# Patient Record
Sex: Male | Born: 1960 | Race: Black or African American | Hispanic: No | Marital: Single | Smoking: Never smoker
Health system: Southern US, Community
[De-identification: ages and names within clinical notes are randomized; demographics above are authoritative.]

## PROBLEM LIST (undated history)

## (undated) DIAGNOSIS — E119 Type 2 diabetes mellitus without complications: Secondary | ICD-10-CM

## (undated) DIAGNOSIS — I10 Essential (primary) hypertension: Secondary | ICD-10-CM

---

## 2018-06-15 ENCOUNTER — Other Ambulatory Visit: Payer: Self-pay

## 2018-06-15 ENCOUNTER — Encounter: Payer: Self-pay | Admitting: Emergency Medicine

## 2018-06-15 ENCOUNTER — Emergency Department: Payer: BLUE CROSS/BLUE SHIELD

## 2018-06-15 ENCOUNTER — Inpatient Hospital Stay
Admission: EM | Admit: 2018-06-15 | Discharge: 2018-06-16 | DRG: 292 | Disposition: A | Discharge status: Home / Self Care | Payer: BLUE CROSS/BLUE SHIELD | Attending: Internal Medicine | Admitting: Internal Medicine

## 2018-06-15 DIAGNOSIS — E876 Hypokalemia: Secondary | ICD-10-CM | POA: Diagnosis present

## 2018-06-15 DIAGNOSIS — F419 Anxiety disorder, unspecified: Secondary | ICD-10-CM | POA: Diagnosis present

## 2018-06-15 DIAGNOSIS — Z79899 Other long term (current) drug therapy: Secondary | ICD-10-CM | POA: Diagnosis not present

## 2018-06-15 DIAGNOSIS — I110 Hypertensive heart disease with heart failure: Principal | ICD-10-CM | POA: Diagnosis present

## 2018-06-15 DIAGNOSIS — Z7984 Long term (current) use of oral hypoglycemic drugs: Secondary | ICD-10-CM

## 2018-06-15 DIAGNOSIS — R0602 Shortness of breath: Secondary | ICD-10-CM | POA: Diagnosis not present

## 2018-06-15 DIAGNOSIS — I509 Heart failure, unspecified: Secondary | ICD-10-CM | POA: Diagnosis present

## 2018-06-15 DIAGNOSIS — I248 Other forms of acute ischemic heart disease: Secondary | ICD-10-CM | POA: Diagnosis present

## 2018-06-15 DIAGNOSIS — E119 Type 2 diabetes mellitus without complications: Secondary | ICD-10-CM | POA: Diagnosis present

## 2018-06-15 HISTORY — DX: Type 2 diabetes mellitus without complications: E11.9

## 2018-06-15 HISTORY — DX: Essential (primary) hypertension: I10

## 2018-06-15 LAB — COMPREHENSIVE METABOLIC PANEL
ALT: 41 U/L (ref 0–44)
AST: 24 U/L (ref 15–41)
Albumin: 4.1 g/dL (ref 3.5–5.0)
Alkaline Phosphatase: 37 U/L — ABNORMAL LOW (ref 38–126)
Anion gap: 9 (ref 5–15)
BUN: 15 mg/dL (ref 6–20)
CHLORIDE: 108 mmol/L (ref 98–111)
CO2: 23 mmol/L (ref 22–32)
Calcium: 8.9 mg/dL (ref 8.9–10.3)
Creatinine, Ser: 1.2 mg/dL (ref 0.61–1.24)
GFR calc Af Amer: >60 mL/min (ref >60)
Glucose, Bld: 116 mg/dL — ABNORMAL HIGH (ref 70–99)
POTASSIUM: 3.7 mmol/L (ref 3.5–5.1)
Sodium: 140 mmol/L (ref 135–145)
Total Bilirubin: 1.4 mg/dL — ABNORMAL HIGH (ref 0.3–1.2)
Total Protein: 7.6 g/dL (ref 6.5–8.1)

## 2018-06-15 LAB — URINALYSIS, COMPLETE (UACMP) WITH MICROSCOPIC
Bacteria, UA: NONE SEEN
Bilirubin Urine: NEGATIVE
GLUCOSE, UA: NEGATIVE mg/dL
Ketones, ur: NEGATIVE mg/dL
Leukocytes,Ua: NEGATIVE
Nitrite: NEGATIVE
PH: 5 (ref 5.0–8.0)
PROTEIN: NEGATIVE mg/dL
Specific Gravity, Urine: 1.016 (ref 1.005–1.030)
Squamous Epithelial / HPF: NONE SEEN (ref 0–5)

## 2018-06-15 LAB — CBC WITH DIFFERENTIAL/PLATELET
Abs Immature Granulocytes: 0.02 10*3/uL (ref 0.00–0.07)
Basophils Absolute: 0.1 10*3/uL (ref 0.0–0.1)
Basophils Relative: 1 %
Eosinophils Absolute: 0.1 10*3/uL (ref 0.0–0.5)
Eosinophils Relative: 1 %
HCT: 50.8 % (ref 39.0–52.0)
Hemoglobin: 16.6 g/dL (ref 13.0–17.0)
Immature Granulocytes: 0 %
Lymphocytes Relative: 21 %
Lymphs Abs: 1.7 10*3/uL (ref 0.7–4.0)
MCH: 27.2 pg (ref 26.0–34.0)
MCHC: 32.7 g/dL (ref 30.0–36.0)
MCV: 83.1 fL (ref 80.0–100.0)
Monocytes Absolute: 0.8 10*3/uL (ref 0.1–1.0)
Monocytes Relative: 10 %
Neutro Abs: 5.5 10*3/uL (ref 1.7–7.7)
Neutrophils Relative %: 67 %
PLATELETS: 192 10*3/uL (ref 150–400)
RBC: 6.11 MIL/uL — AB (ref 4.22–5.81)
RDW: 15.9 % — ABNORMAL HIGH (ref 11.5–15.5)
WBC: 8.2 10*3/uL (ref 4.0–10.5)
nRBC: 0 % (ref 0.0–0.2)

## 2018-06-15 LAB — TROPONIN I
TROPONIN I: 0.05 ng/mL — AB (ref <0.03)
Troponin I: 0.07 ng/mL (ref <0.03)
Troponin I: 0.07 ng/mL (ref <0.03)

## 2018-06-15 LAB — GLUCOSE, CAPILLARY
Glucose-Capillary: 202 mg/dL — ABNORMAL HIGH (ref 70–99)
Glucose-Capillary: 112 mg/dL — ABNORMAL HIGH (ref 70–99)

## 2018-06-15 MED ORDER — ASPIRIN EC 81 MG PO TBEC
81.0000 mg | DELAYED_RELEASE_TABLET | Freq: Every day | ORAL | Status: DC
  Administered 2018-06-15 – 2018-06-16 (×2): 81 mg via ORAL
  Filled 2018-06-15: qty 1

## 2018-06-15 MED ORDER — SODIUM CHLORIDE 0.9 % IV SOLN: 250.0000 mL | INTRAVENOUS | Status: DC | PRN

## 2018-06-15 MED ORDER — INSULIN ASPART 100 UNIT/ML ~~LOC~~ SOLN
0.0000 [IU] | Freq: Three times a day (TID) | SUBCUTANEOUS | Status: DC
  Administered 2018-06-15: 5 [IU] via SUBCUTANEOUS
  Administered 2018-06-16: 3 [IU] via SUBCUTANEOUS
  Filled 2018-06-15 (×2): qty 1

## 2018-06-15 MED ORDER — FUROSEMIDE 10 MG/ML IJ SOLN
40.0000 mg | Freq: Once | INTRAMUSCULAR | Status: AC
  Administered 2018-06-15: 40 mg via INTRAVENOUS
  Filled 2018-06-15: qty 4

## 2018-06-15 MED ORDER — ENOXAPARIN SODIUM 40 MG/0.4ML ~~LOC~~ SOLN: 40.0000 mg | SUBCUTANEOUS | Status: DC

## 2018-06-15 MED ORDER — LABETALOL HCL 5 MG/ML IV SOLN
10.0000 mg | Freq: Once | INTRAVENOUS | Status: AC
  Administered 2018-06-15: 10 mg via INTRAVENOUS
  Filled 2018-06-15: qty 4

## 2018-06-15 MED ORDER — SODIUM CHLORIDE 0.9% FLUSH
3.0000 mL | Freq: Two times a day (BID) | INTRAVENOUS | Status: DC
  Administered 2018-06-15 – 2018-06-16 (×2): 3 mL via INTRAVENOUS

## 2018-06-15 MED ORDER — ALPRAZOLAM 0.25 MG PO TABS
0.2500 mg | ORAL_TABLET | Freq: Two times a day (BID) | ORAL | Status: DC | PRN
  Administered 2018-06-15: 0.25 mg via ORAL
  Filled 2018-06-15: qty 1

## 2018-06-15 MED ORDER — METHOCARBAMOL 500 MG PO TABS
500.0000 mg | ORAL_TABLET | Freq: Three times a day (TID) | ORAL | Status: DC | PRN
  Filled 2018-06-15: qty 1

## 2018-06-15 MED ORDER — HYDRALAZINE HCL 20 MG/ML IJ SOLN: 15.0000 mg | INTRAMUSCULAR | Status: DC | PRN

## 2018-06-15 MED ORDER — NITROGLYCERIN 0.4 MG/SPRAY TL SOLN
1.0000 | Status: DC | PRN
  Filled 2018-06-15: qty 4.9

## 2018-06-15 MED ORDER — ACETAMINOPHEN 325 MG PO TABS: 650.0000 mg | ORAL_TABLET | ORAL | Status: DC | PRN

## 2018-06-15 MED ORDER — CARVEDILOL 12.5 MG PO TABS
12.5000 mg | ORAL_TABLET | Freq: Two times a day (BID) | ORAL | Status: DC
  Administered 2018-06-15 – 2018-06-16 (×2): 12.5 mg via ORAL
  Filled 2018-06-15 (×2): qty 1

## 2018-06-15 MED ORDER — ATORVASTATIN CALCIUM 20 MG PO TABS
20.0000 mg | ORAL_TABLET | Freq: Every day | ORAL | Status: DC
  Administered 2018-06-15: 20 mg via ORAL
  Filled 2018-06-15: qty 1

## 2018-06-15 MED ORDER — PNEUMOCOCCAL VAC POLYVALENT 25 MCG/0.5ML IJ INJ
0.5000 mL | INJECTION | INTRAMUSCULAR | Status: DC
  Filled 2018-06-15: qty 0.5

## 2018-06-15 MED ORDER — FUROSEMIDE 10 MG/ML IJ SOLN
40.0000 mg | Freq: Two times a day (BID) | INTRAMUSCULAR | Status: DC
  Administered 2018-06-15 – 2018-06-16 (×2): 40 mg via INTRAVENOUS
  Filled 2018-06-15 (×2): qty 4

## 2018-06-15 MED ORDER — MORPHINE SULFATE (PF) 2 MG/ML IV SOLN: 2.0000 mg | INTRAVENOUS | Status: DC | PRN

## 2018-06-15 MED ORDER — INSULIN ASPART 100 UNIT/ML ~~LOC~~ SOLN: 0.0000 [IU] | Freq: Every day | SUBCUTANEOUS | Status: DC

## 2018-06-15 MED ORDER — ONDANSETRON HCL 4 MG/2ML IJ SOLN: 4.0000 mg | Freq: Four times a day (QID) | INTRAMUSCULAR | Status: DC | PRN

## 2018-06-15 MED ORDER — LISINOPRIL 20 MG PO TABS: 20.0000 mg | ORAL_TABLET | Freq: Every day | ORAL | Status: DC

## 2018-06-15 MED ORDER — SODIUM CHLORIDE 0.9% FLUSH: 3.0000 mL | INTRAVENOUS | Status: DC | PRN

## 2018-06-15 NOTE — Progress Notes (Signed)
Family Meeting Note  Advance Directive:yes  Today a meeting took place with the Patient.  Patient is able to participate   The following clinical team members were present during this meeting:MD  The following were discussed:Patient's diagnosis: Congestive heart failure, Patient's progosis: Unable to determine and Goals for treatment: Full Code  Additional follow-up to be provided: prn  Time spent during discussion:20 minutes  Bertrum Sol, MD

## 2018-06-15 NOTE — ED Triage Notes (Signed)
Pt to ER via EMS from [facility] with c/o shortness of breath on exertion.  Pt states symptoms for last 3-4 days.  Pt also reports mild cough and feeling worn out.

## 2018-06-15 NOTE — ED Notes (Signed)
Pt transported to room 258 

## 2018-06-15 NOTE — H&P (Signed)
[facility] [place] at [facility]   PATIENT NAME: Steven Carey    MR#:  [id number]  DATE OF BIRTH:  [birth year]  DATE OF ADMISSION:  06/15/2018  PRIMARY CARE PHYSICIAN: System, Pcp Not In   REQUESTING/REFERRING PHYSICIAN:   CHIEF COMPLAINT:   Chief Complaint  Patient presents with  . Shortness of Breath    HISTORY OF PRESENT ILLNESS: Steven Carey  is a 58 y.o. male with a known history per below presenting to the emergency room with 3 to 4-day history of worsening shortness of breath, dyspnea on exertion, some problems with breathing with recumbency, and emergency room patient was found to be tachypneic, hypertensive, troponin elevation of 0.05, chest x-ray noted for heart failure, patient evaluated in emergency room, no apparent distress, resting comfortably in bed, patient now be admitted for acute congestive heart failure exacerbation most likely due to accelerated hypertension.    PAST MEDICAL HISTORY:   Past Medical History:  Diagnosis Date  . Diabetes mellitus without complication (HCC)   . Hypertension     PAST SURGICAL HISTORY: History reviewed. No pertinent surgical history.  SOCIAL HISTORY:  Social History   Tobacco Use  . Smoking status: Never Smoker  . Smokeless tobacco: Never Used  Substance Use Topics  . Alcohol use: Never    Frequency: Never    FAMILY HISTORY:  Hypertension  DRUG ALLERGIES: No Known Allergies  REVIEW OF SYSTEMS:   CONSTITUTIONAL: No fever, fatigue or weakness.  EYES: No blurred or double vision.  EARS, NOSE, AND THROAT: No tinnitus or ear pain.  RESPIRATORY: + cough, +shortness of breath, wheezing or hemoptysis.  CARDIOVASCULAR: No chest pain, orthopnea, edema.  GASTROINTESTINAL: No nausea, vomiting, diarrhea or abdominal pain.  GENITOURINARY: No dysuria, hematuria.  ENDOCRINE: No polyuria, nocturia,  HEMATOLOGY: No anemia, easy bruising or bleeding SKIN: No rash or lesion. MUSCULOSKELETAL: No joint pain or  arthritis.   NEUROLOGIC: No tingling, numbness, weakness.  PSYCHIATRY: No anxiety or depression.   MEDICATIONS AT HOME:  Prior to Admission medications   Medication Sig Start Date End Date Taking? Authorizing Provider  atorvastatin (LIPITOR) 20 MG tablet Take 20 mg by mouth daily. 07/14/17 07/14/18 Yes [provider]  lisinopril (PRINIVIL,ZESTRIL) 5 MG tablet Take 5 mg by mouth daily. 07/14/17 07/14/18 Yes [provider]  metFORMIN (GLUCOPHAGE-XR) 500 MG 24 hr tablet Take 500 mg by mouth daily. 07/14/17 07/14/18 Yes [provider]      PHYSICAL EXAMINATION:   VITAL SIGNS: Blood pressure (!) 169/116, pulse 87, temperature 98.7 F (37.1 C), resp. rate (!) 22, height 6\' 1"  (1.854 m), weight 100.7 kg, SpO2 98 %.  GENERAL:  58 y.o.-year-old patient lying in the bed with no acute distress.  EYES: Pupils equal, round, reactive to light and accommodation. No scleral icterus. Extraocular muscles intact.  HEENT: Head atraumatic, normocephalic. Oropharynx and nasopharynx clear.  NECK:  Supple, no jugular venous distention. No thyroid enlargement, no tenderness.  LUNGS: Normal breath sounds bilaterally, no wheezing, rales,rhonchi or crepitation. No use of accessory muscles of respiration.  CARDIOVASCULAR: S1, S2 normal. No murmurs, rubs, or gallops.  ABDOMEN: Soft, nontender, nondistended. Bowel sounds present. No organomegaly or mass.  EXTREMITIES: No pedal edema, cyanosis, or clubbing.  NEUROLOGIC: Cranial nerves II through XII are intact. Muscle strength 5/5 in all extremities. Sensation intact. Gait not checked.  PSYCHIATRIC: The patient is alert and oriented x 3.  SKIN: No obvious rash, lesion, or ulcer.   LABORATORY PANEL:   CBC Recent Labs  Lab 06/15/18 1127  WBC 8.2  HGB 16.6  HCT 50.8  PLT 192  MCV 83.1  MCH 27.2  MCHC 32.7  RDW 15.9*  LYMPHSABS 1.7  MONOABS 0.8  EOSABS 0.1  BASOSABS 0.1    ------------------------------------------------------------------------------------------------------------------  Chemistries  Recent Labs  Lab 06/15/18 1156  NA 140  K 3.7  CL 108  CO2 23  GLUCOSE 116*  BUN 15  CREATININE 1.20  CALCIUM 8.9  AST 24  ALT 41  ALKPHOS 37*  BILITOT 1.4*   ------------------------------------------------------------------------------------------------------------------ estimated creatinine clearance is 82.7 mL/min (by C-G formula based on SCr of 1.2 mg/dL). ------------------------------------------------------------------------------------------------------------------ No results for input(s): TSH, T4TOTAL, T3FREE, THYROIDAB in the last 72 hours.  Invalid input(s): FREET3   Coagulation profile No results for input(s): INR, PROTIME in the last 168 hours. ------------------------------------------------------------------------------------------------------------------- No results for input(s): DDIMER in the last 72 hours. -------------------------------------------------------------------------------------------------------------------  Cardiac Enzymes Recent Labs  Lab 06/15/18 1156  TROPONINI 0.05*   ------------------------------------------------------------------------------------------------------------------ Invalid input(s): POCBNP  ---------------------------------------------------------------------------------------------------------------  Urinalysis    Component Value Date/Time   COLORURINE YELLOW (A) 06/15/2018 1228   APPEARANCEUR CLEAR (A) 06/15/2018 1228   LABSPEC 1.016 06/15/2018 1228   PHURINE 5.0 06/15/2018 1228   GLUCOSEU NEGATIVE 06/15/2018 1228   HGBUR SMALL (A) 06/15/2018 1228   BILIRUBINUR NEGATIVE 06/15/2018 1228   KETONESUR NEGATIVE 06/15/2018 1228   PROTEINUR NEGATIVE 06/15/2018 1228   NITRITE NEGATIVE 06/15/2018 1228   LEUKOCYTESUR NEGATIVE 06/15/2018 1228     RADIOLOGY: Dg Chest 2  View  Result Date: 06/15/2018 CLINICAL DATA:  Shortness of breath on exertion EXAM: CHEST - 2 VIEW COMPARISON:  None. FINDINGS: Cardiac shadow is enlarged. Mild vascular congestion is noted with mild interstitial edema. Small effusions are noted posteriorly. Mild atelectatic changes are noted in the bases bilaterally. IMPRESSION: Bibasilar atelectasis. Mild vascular congestion with interstitial edema. Electronically Signed   By: Alcide Clever M.D.   On: 06/15/2018 12:18    EKG: Orders placed or performed during the hospital encounter of 06/15/18  . EKG 12-Lead  . EKG 12-Lead  . ED EKG  . ED EKG    IMPRESSION AND PLAN: *Acute congestive heart failure exacerbation Most likely exacerbated by accelerated hypertension Admit to regular nursing for bed on our congestive heart failure protocol, cycle cardiac enzymes, check echocardiogram, IV Lasix twice daily, good blood pressure pressure control, Coreg, lisinopril, strict I&O monitoring, daily weights, low-sodium diet, supplemental oxygen weaning as tolerated  *Acute accelerated hypertension Increase lisinopril, start Coreg, IV Lasix, IV hydralazine as needed systolic blood pressure greater than 160, vitals per routine, make changes as per necessary  *Acute elevated troponins Most likely secondary to demand ischemia/congestive heart failure Plan of care as stated above  *Chronic diabetes mellitus type 2 Hold metformin while in house, sliding-scale insulin Accu-Cheks per routine, check hemoglobin A1c determine level control   All the records are reviewed and case discussed with ED provider. Management plans discussed with the patient, family and they are in agreement.  CODE STATUS:full    TOTAL TIME TAKING CARE OF THIS PATIENT: 45 minutes.    Evelena Asa  M.D on 06/15/2018   Between 7am to 6pm - Pager - [phone]  After 6pm go to [email or web address] - password [email or web address]  [facility]  Office   [phone]  CC: Primary care physician; System, Pcp Not In   Note: This dictation was prepared with Dragon dictation along with smaller phrase technology. Any transcriptional errors that result from this process are unintentional.

## 2018-06-15 NOTE — ED Notes (Signed)
ED TO INPATIENT HANDOFF REPORT  ED Nurse Name and Phone #: Victorino Dike [phone]  S Name/Age/Gender Steven Carey 58 y.o. male Room/Bed: ED14A/ED14A  Code Status   Code Status: Not on file  Home/SNF/Other Home Patient oriented to: self, place, time and situation Is this baseline? Yes   Triage Complete: Triage complete  Chief Complaint sob  Triage Note Pt to ER via EMS from [facility] with c/o shortness of breath on exertion.  Pt states symptoms for last 3-4 days.  Pt also reports mild cough and feeling worn out.    Allergies No Known Allergies  Level of Care/Admitting Diagnosis ED Disposition    ED Disposition Condition Comment   Admit  Hospital Area: [facility] [[id number]]  Level of Care: Med-Surg [16]  Diagnosis: CHF (congestive heart failure) [facility]) [[id number]]  Admitting Physician: Bertrum Sol [[id number]]  Attending Physician: Bertrum Sol [[id number]]  Estimated length of stay: past midnight tomorrow  Certification:: I certify this patient will need inpatient services for at least 2 midnights  PT Class (Do Not Modify): Inpatient [101]  PT Acc Code (Do Not Modify): Private [1]       B Medical/Surgery History Past Medical History:  Diagnosis Date  . Diabetes mellitus without complication (HCC)   . Hypertension    History reviewed. No pertinent surgical history.   A IV Location/Drains/Wounds Patient Lines/Drains/Airways Status   Active Line/Drains/Airways    Name:   Placement date:   Placement time:   Site:   Days:   Peripheral IV 06/15/18 Right Antecubital   06/15/18    1127    Antecubital   less than 1          Intake/Output Last 24 hours No intake or output data in the 24 hours ending 06/15/18 1349  Labs/Imaging Results for orders placed or performed during the hospital encounter of 06/15/18 (from the past 48 hour(s))  CBC with Differential     Status: Abnormal   Collection Time: 06/15/18 11:27 AM  Result Value Ref Range    WBC 8.2 4.0 - 10.5 K/uL   RBC 6.11 (H) 4.22 - 5.81 MIL/uL   Hemoglobin 16.6 13.0 - 17.0 g/dL   HCT 34.2 87.6 - 81.1 %   MCV 83.1 80.0 - 100.0 fL   MCH 27.2 26.0 - 34.0 pg   MCHC 32.7 30.0 - 36.0 g/dL   RDW 57.2 (H) 62.0 - 35.5 %   Platelets 192 150 - 400 K/uL   nRBC 0.0 0.0 - 0.2 %   Neutrophils Relative % 67 %   Neutro Abs 5.5 1.7 - 7.7 K/uL   Lymphocytes Relative 21 %   Lymphs Abs 1.7 0.7 - 4.0 K/uL   Monocytes Relative 10 %   Monocytes Absolute 0.8 0.1 - 1.0 K/uL   Eosinophils Relative 1 %   Eosinophils Absolute 0.1 0.0 - 0.5 K/uL   Basophils Relative 1 %   Basophils Absolute 0.1 0.0 - 0.1 K/uL   Immature Granulocytes 0 %   Abs Immature Granulocytes 0.02 0.00 - 0.07 K/uL    Comment: Performed at [facility], [address] [place], [place] [address]  Comprehensive metabolic panel     Status: Abnormal   Collection Time: 06/15/18 11:56 AM  Result Value Ref Range   Sodium 140 135 - 145 mmol/L   Potassium 3.7 3.5 - 5.1 mmol/L   Chloride 108 98 - 111 mmol/L   CO2 23 22 - 32 mmol/L   Glucose, Bld 116 (H)  70 - 99 mg/dL   BUN 15 6 - 20 mg/dL   Creatinine, Ser 5.401.20 0.61 - 1.24 mg/dL   Calcium 8.9 8.9 - 98.110.3 mg/dL   Total Protein 7.6 6.5 - 8.1 g/dL   Albumin 4.1 3.5 - 5.0 g/dL   AST 24 15 - 41 U/L   ALT 41 0 - 44 U/L   Alkaline Phosphatase 37 (L) 38 - 126 U/L   Total Bilirubin 1.4 (H) 0.3 - 1.2 mg/dL   GFR calc non Af Amer >60 >60 mL/min   GFR calc Af Amer >60 >60 mL/min   Anion gap 9 5 - 15    Comment: Performed at [facility], [address] [place], [place] [address]  Troponin I -     Status: Abnormal   Collection Time: 06/15/18 11:56 AM  Result Value Ref Range   Troponin I 0.05 (HH) <0.03 ng/mL    Comment: CRITICAL RESULT CALLED TO, READ BACK BY AND VERIFIED WITH Alexzander Dolinger [facility] 06/15/18 1244 KLW Performed at [facility], [address] [place], [place] [address]   Urinalysis, Complete w Microscopic     Status: Abnormal    Collection Time: 06/15/18 12:28 PM  Result Value Ref Range   Color, Urine YELLOW (A) YELLOW   APPearance CLEAR (A) CLEAR   Specific Gravity, Urine 1.016 1.005 - 1.030   pH 5.0 5.0 - 8.0   Glucose, UA NEGATIVE NEGATIVE mg/dL   Hgb urine dipstick SMALL (A) NEGATIVE   Bilirubin Urine NEGATIVE NEGATIVE   Ketones, ur NEGATIVE NEGATIVE mg/dL   Protein, ur NEGATIVE NEGATIVE mg/dL   Nitrite NEGATIVE NEGATIVE   Leukocytes,Ua NEGATIVE NEGATIVE   RBC / HPF 0-5 0 - 5 RBC/hpf   WBC, UA 0-5 0 - 5 WBC/hpf   Bacteria, UA NONE SEEN NONE SEEN   Squamous Epithelial / LPF NONE SEEN 0 - 5   Mucus PRESENT     Comment: Performed at [facility], [address] [place], [place] [address]   Dg Chest 2 View  Result Date: 06/15/2018 CLINICAL DATA:  Shortness of breath on exertion EXAM: CHEST - 2 VIEW COMPARISON:  None. FINDINGS: Cardiac shadow is enlarged. Mild vascular congestion is noted with mild interstitial edema. Small effusions are noted posteriorly. Mild atelectatic changes are noted in the bases bilaterally. IMPRESSION: Bibasilar atelectasis. Mild vascular congestion with interstitial edema. Electronically Signed   By: Alcide CleverMark  Lukens M.D.   On: 06/15/2018 12:18    Pending Labs [employer] Labs (From admission, onward)    Start     Ordered   Signed and Held  HIV antibody (Routine Testing)  Once,   R     Signed and Held   Signed and Held  Basic metabolic panel  Daily,   R     Signed and Held   Signed and Held  CBC  (enoxaparin (LOVENOX)    CrCl >/= 30 ml/min)  Once,   R    Comments:  Baseline for enoxaparin therapy IF NOT ALREADY DRAWN.  Notify MD if PLT < 100 K.    Signed and Held   Signed and Held  Creatinine, serum  (enoxaparin (LOVENOX)    CrCl >/= 30 ml/min)  Once,   R    Comments:  Baseline for enoxaparin therapy IF NOT ALREADY DRAWN.    Signed and Held   Signed and Held  Creatinine, serum  (enoxaparin (LOVENOX)    CrCl >/= 30 ml/min)  Weekly,   R  Comments:  while on enoxaparin  therapy    Signed and Held   Signed and Held  Troponin I - Now Then Q6H  Now then every 6 hours,   R     Signed and Held          Vitals/Pain Today's Vitals   06/15/18 1200 06/15/18 1230 06/15/18 1322 06/15/18 1330  BP: (!) 220/154 (!) 194/135 (!) 185/127 (!) 169/116  Pulse: (!) 108 84 90 87  Resp:    (!) 22  Temp:      SpO2: 94% 94% 100% 98%  Weight:      Height:      PainSc:        Isolation Precautions No active isolations  Medications Medications  labetalol (NORMODYNE,TRANDATE) injection 10 mg (10 mg Intravenous Given 06/15/18 1329)  furosemide (LASIX) injection 40 mg (40 mg Intravenous Given 06/15/18 1329)    Mobility walks Low fall risk   Focused Assessments Cardiac Assessment Handoff:  Cardiac Rhythm: Normal sinus rhythm Lab Results  Component Value Date   TROPONINI 0.05 (HH) 06/15/2018   No results found for: DDIMER Does the Patient currently have chest pain? No  , Pulmonary Assessment Handoff:  Lung sounds:  crackles          R Recommendations: See Admitting Provider Note  Report given to:   Additional Notes:

## 2018-06-15 NOTE — ED Provider Notes (Addendum)
[facility] Emergency Department Provider Note   ____________________________________________    I have reviewed the triage vital signs and the nursing notes.   HISTORY  Chief Complaint Shortness of Breath     HPI Steven Carey is a 58 y.o. male who presents with complaints of mild shortness of breath with exertion but primarily complains of fatigue and generalized weakness over the last 3 to 4 days.  Was sent here from urgent care because of EKG which showed a PVC.  Denies fevers or chills.  No cough.  No recent travel.  No chest pain.  No nausea or vomiting or diarrhea.  He does have a history of diabetes and reports compliance with his medications but also notes frequent urination.  Past Medical History:  Diagnosis Date  . Diabetes mellitus without complication (HCC)   . Hypertension     There are no active problems to display for this patient.   History reviewed. No pertinent surgical history.  Prior to Admission medications   Medication Sig Start Date End Date Taking? Authorizing Provider  atorvastatin (LIPITOR) 20 MG tablet Take 20 mg by mouth daily. 07/14/17 07/14/18 Yes [provider]  lisinopril (PRINIVIL,ZESTRIL) 5 MG tablet Take 5 mg by mouth daily. 07/14/17 07/14/18 Yes [provider]  metFORMIN (GLUCOPHAGE-XR) 500 MG 24 hr tablet Take 500 mg by mouth daily. 07/14/17 07/14/18 Yes [provider]     Allergies Patient has no known allergies.  History reviewed. No pertinent family history.  Social History Social History   Tobacco Use  . Smoking status: Never Smoker  . Smokeless tobacco: Never Used  Substance Use Topics  . Alcohol use: Never    Frequency: Never  . Drug use: Never    Review of Systems  Constitutional: No fever/chills Eyes: No visual changes.  ENT: No sore throat. Cardiovascular: Denies chest pain. Respiratory: As above Gastrointestinal: No abdominal pain.  No nausea, no vomiting.     Genitourinary: Negative for dysuria.  Urinary frequency Musculoskeletal: Negative for back pain. Skin: Negative for rash. Neurological: Negative for headaches    ____________________________________________   PHYSICAL EXAM:  VITAL SIGNS: ED Triage Vitals  Enc Vitals Group     BP 06/15/18 1117 (!) 201/127     Pulse Rate 06/15/18 1117 92     Resp 06/15/18 1117 (!) 28     Temp 06/15/18 1117 98.7 F (37.1 C)     Temp src --      SpO2 06/15/18 1117 99 %     Weight 06/15/18 1118 100.7 kg (222 lb)     Height 06/15/18 1118 1.854 m (6\' 1" )     Head Circumference --      Peak Flow --      Pain Score 06/15/18 1117 0     Pain Loc --      Pain Edu? --      Excl. in GC? --     Constitutional: Alert and oriented. No acute distress. Pleasant and interactive  Nose: No congestion/rhinnorhea. Mouth/Throat: Mucous membranes are moist.   Neck:  Painless ROM Cardiovascular: Normal rate, regular rhythm. Grossly normal heart sounds.  Good peripheral circulation. Respiratory: Normal respiratory effort.  No retractions. Lungs CTAB. Gastrointestinal: Soft and nontender. No distention.  No CVA tenderness. Genitourinary: deferred Musculoskeletal: No lower extremity tenderness nor edema.  Warm and well perfused Neurologic:  Normal speech and language. No gross focal neurologic deficits are appreciated.  Skin:  Skin is warm, dry and intact. No  rash noted. Psychiatric: Mood and affect are normal. Speech and behavior are normal.  ____________________________________________   LABS (all labs ordered are listed, but only abnormal results are displayed)  Labs Reviewed  CBC WITH DIFFERENTIAL/PLATELET - Abnormal; Notable for the following components:      Result Value   RBC 6.11 (*)    RDW 15.9 (*)    All other components within normal limits  URINALYSIS, COMPLETE (UACMP) WITH MICROSCOPIC - Abnormal; Notable for the following components:   Color, Urine YELLOW (*)    APPearance CLEAR (*)    Hgb  urine dipstick SMALL (*)    All other components within normal limits  COMPREHENSIVE METABOLIC PANEL - Abnormal; Notable for the following components:   Glucose, Bld 116 (*)    Alkaline Phosphatase 37 (*)    Total Bilirubin 1.4 (*)    All other components within normal limits  TROPONIN I - Abnormal; Notable for the following components:   Troponin I 0.05 (*)    All other components within normal limits   ____________________________________________  EKG  ED ECG REPORT I, Jene Every, the attending physician, personally viewed and interpreted this ECG.  Date: 06/15/2018  Rhythm: normal sinus rhythm QRS Axis: normal Intervals: normal ST/T Wave abnormalities: PVC, nonspecific changes Narrative Interpretation: no evidence of acute ischemia  ____________________________________________  RADIOLOGY  Chest x-ray ____________________________________________   PROCEDURES  Procedure(s) performed: No  Procedures   Critical Care performed: yes  CRITICAL CARE Performed by: Jene Every   Total critical care time: 30 min  Critical care time was exclusive of separately billable procedures and treating other patients.  Critical care was necessary to treat or prevent imminent or life-threatening deterioration.  Critical care was time spent personally by me on the following activities: development of treatment plan with patient and/or surrogate as well as nursing, discussions with consultants, evaluation of patient's response to treatment, examination of patient, obtaining history from patient or surrogate, ordering and performing treatments and interventions, ordering and review of laboratory studies, ordering and review of radiographic studies, pulse oximetry and re-evaluation of patient's condition.  ____________________________________________   INITIAL IMPRESSION / ASSESSMENT AND PLAN / ED COURSE  Pertinent labs & imaging results that were available during my care of  the patient were reviewed by me and considered in my medical decision making (see chart for details).  Patient well-appearing in no acute distress, exam is quite reassuring overall, he does have significant hypertension but apparently has a history of high blood pressure.  Will obtain chest x-ray, labs, troponin  Chest x-ray is concerning for interstitial edema, pulmonary vascular congestion.  Mildly elevated troponin consistent with strain, likely related to new onset CHF.  We will give IV labetalol for his significantly elevated blood pressure and IV Lasix and admit to the hospitalist service    ____________________________________________   FINAL CLINICAL IMPRESSION(S) / ED DIAGNOSES  Final diagnoses:  Acute congestive heart failure, unspecified heart failure type [facility])        Note:  This document was prepared using Dragon voice recognition software and may include unintentional dictation errors.   Jene Every, MD 06/15/18 1313    Jene Every, MD 06/15/18 1314

## 2018-06-15 NOTE — Progress Notes (Signed)
Pt's meds sealed up and sent to pharmacy

## 2018-06-15 NOTE — ED Notes (Signed)
Date and time results received: 06/15/18 12:43 PM    Test: Troponin  Critical Value: 0.05  Name of Provider Notified: Cyril Loosen

## 2018-06-16 ENCOUNTER — Inpatient Hospital Stay (HOSPITAL_COMMUNITY)
Admit: 2018-06-16 | Discharge: 2018-06-16 | Disposition: A | Discharge status: Home / Self Care | Payer: BLUE CROSS/BLUE SHIELD | Attending: Family Medicine | Admitting: Family Medicine

## 2018-06-16 DIAGNOSIS — R0602 Shortness of breath: Secondary | ICD-10-CM

## 2018-06-16 LAB — ECHOCARDIOGRAM COMPLETE
Height: 73 in
WEIGHTICAEL: 3494.4 [oz_av]

## 2018-06-16 LAB — GLUCOSE, CAPILLARY
Glucose-Capillary: 94 mg/dL (ref 70–99)
Glucose-Capillary: 174 mg/dL — ABNORMAL HIGH (ref 70–99)
Glucose-Capillary: 98 mg/dL (ref 70–99)

## 2018-06-16 LAB — BASIC METABOLIC PANEL
Anion gap: 9 (ref 5–15)
BUN: 22 mg/dL — ABNORMAL HIGH (ref 6–20)
CALCIUM: 8.8 mg/dL — AB (ref 8.9–10.3)
CO2: 25 mmol/L (ref 22–32)
Chloride: 108 mmol/L (ref 98–111)
Creatinine, Ser: 1.26 mg/dL — ABNORMAL HIGH (ref 0.61–1.24)
GFR calc Af Amer: >60 mL/min (ref >60)
GFR calc non Af Amer: >60 mL/min (ref >60)
Glucose, Bld: 102 mg/dL — ABNORMAL HIGH (ref 70–99)
Potassium: 3.3 mmol/L — ABNORMAL LOW (ref 3.5–5.1)
Sodium: 142 mmol/L (ref 135–145)

## 2018-06-16 LAB — HIV ANTIBODY (ROUTINE TESTING W REFLEX): HIV Screen 4th Generation wRfx: NONREACTIVE

## 2018-06-16 LAB — TROPONIN I: Troponin I: 0.08 ng/mL (ref <0.03)

## 2018-06-16 MED ORDER — FUROSEMIDE 40 MG PO TABS: 40.0000 mg | ORAL_TABLET | Freq: Every day | ORAL | 0 refills | Status: AC

## 2018-06-16 MED ORDER — ALPRAZOLAM 0.25 MG PO TABS: 0.2500 mg | ORAL_TABLET | Freq: Every evening | ORAL | 0 refills | Status: AC | PRN

## 2018-06-16 MED ORDER — POTASSIUM CHLORIDE ER 10 MEQ PO TBCR: 10.0000 meq | EXTENDED_RELEASE_TABLET | Freq: Every day | ORAL | 0 refills | Status: AC

## 2018-06-16 MED ORDER — POTASSIUM CHLORIDE CRYS ER 20 MEQ PO TBCR
20.0000 meq | EXTENDED_RELEASE_TABLET | Freq: Every day | ORAL | Status: DC
  Administered 2018-06-16: 20 meq via ORAL
  Filled 2018-06-16: qty 1

## 2018-06-16 MED ORDER — FUROSEMIDE 40 MG PO TABS: 40.0000 mg | ORAL_TABLET | Freq: Every day | ORAL | 0 refills | Status: DC

## 2018-06-16 MED ORDER — LOSARTAN POTASSIUM 50 MG PO TABS: 50.0000 mg | ORAL_TABLET | Freq: Every day | ORAL | 0 refills | Status: AC

## 2018-06-16 MED ORDER — HYDRALAZINE HCL 25 MG PO TABS
25.0000 mg | ORAL_TABLET | Freq: Three times a day (TID) | ORAL | Status: DC
  Administered 2018-06-16 (×2): 25 mg via ORAL
  Filled 2018-06-16 (×2): qty 1

## 2018-06-16 MED ORDER — CARVEDILOL 12.5 MG PO TABS: 12.5000 mg | ORAL_TABLET | Freq: Two times a day (BID) | ORAL | 0 refills | Status: AC

## 2018-06-16 MED ORDER — HYDRALAZINE HCL 25 MG PO TABS: 25.0000 mg | ORAL_TABLET | Freq: Three times a day (TID) | ORAL | 0 refills | Status: DC

## 2018-06-16 NOTE — Plan of Care (Signed)

## 2018-06-16 NOTE — Progress Notes (Signed)
*  PRELIMINARY RESULTS* Echocardiogram 2D Echocardiogram has been performed.  Joanette Gula Ersel Wadleigh 06/16/2018, 9:36 AM

## 2018-06-16 NOTE — Progress Notes (Signed)
Patient given discharge instructions with prescriptions. Medications from pharmacy returned to patient. IV taken out and tele monitor off. Heart failure packet given to patient, EMMI videos shown to patient. Patient verbalized understanding. Patient going home.

## 2018-06-16 NOTE — Care Management Note (Signed)
Case Management Note  Patient Details  Name: Steven Carey MRN: [id number] Date of Birth: 1961-02-03  Subjective/Objective:    Patient is from home alone.  Sent from urgent care for SOB and weakness.    ECHO pending.  Receiving IV lasix.  Does not have a scale at home but states he can obtain one without difficulty.  Explained importance of daily weights.  Patient is currently on RA.  Changing PCP's from [facility] to [facility] Dr. Kinnie Feil and Dr. Welton Flakes for Cards.  He is completely independent in all ADL's.  Obtains medications at [facility] without difficulty.  Has a HF clinic appointment.  No further needs identified at this time.             Action/Plan:   Expected Discharge Date:  06/16/18               Expected Discharge Plan:  Home/Self Care  In-House Referral:     Discharge planning Services  CM Consult, HF Clinic  Post Acute Care Choice:    Choice offered to:     DME Arranged:    DME Agency:     HH Arranged:    HH Agency:     Status of Service:  Completed, signed off  If discussed at [employer] of Stay Meetings, dates discussed:    Additional Comments:  Sherren Kerns, RN 06/16/2018, 3:43 PM

## 2018-06-16 NOTE — Plan of Care (Signed)
  Problem: Education: Goal: Ability to demonstrate management of disease process will improve 06/16/2018 1654 by Weldon Picking, Manuella Ghazi, RN Outcome: Adequate for Discharge 06/16/2018 1550 by Weldon Picking, Manuella Ghazi, RN Outcome: Adequate for Discharge Goal: Ability to verbalize understanding of medication therapies will improve 06/16/2018 1654 by Erma Heritage, RN Outcome: Adequate for Discharge 06/16/2018 1550 by Erma Heritage, RN Outcome: Adequate for Discharge Goal: Individualized Educational Video(s) 06/16/2018 1654 by Erma Heritage, RN Outcome: Adequate for Discharge 06/16/2018 1550 by Erma Heritage, RN Outcome: Adequate for Discharge   Problem: Activity: Goal: Capacity to carry out activities will improve 06/16/2018 1654 by Weldon Picking, Manuella Ghazi, RN Outcome: Adequate for Discharge 06/16/2018 1550 by Weldon Picking, Manuella Ghazi, RN Outcome: Adequate for Discharge   Problem: Cardiac: Goal: Ability to achieve and maintain adequate cardiopulmonary perfusion will improve 06/16/2018 1654 by Erma Heritage, RN Outcome: Adequate for Discharge 06/16/2018 1550 by Erma Heritage, RN Outcome: Adequate for Discharge

## 2018-06-16 NOTE — Discharge Summary (Signed)
Steven Carey, is a 58 y.o. male  DOB 1961/02/26  MRN [id number].  Admission date:  06/15/2018  Admitting Physician  Bertrum Sol, MD  Discharge Date:  06/16/2018   Primary MD  System, Pcp Not In  Recommendations for primary care physician for things to follow:   Patient has PCP at [facility] but he wants someone in local area.  Will ask  case management.   Admission Diagnosis  Acute congestive heart failure, unspecified heart failure type (HCC) [I50.9]   Discharge Diagnosis  Acute congestive heart failure, unspecified heart failure type (HCC) [I50.9]    Active Problems:   CHF (congestive heart failure) (HCC)      Past Medical History:  Diagnosis Date  . Diabetes mellitus without complication (HCC)   . Hypertension     History reviewed. No pertinent surgical history.     History of present illness and  Hospital Course:     Kindly see H&P for history of present illness and admission details, please review complete Labs, Consult reports and Test reports for all details in brief  HPI  from the history and physical done on the day of admission 58 year old male with history of longstanding hypertension comes in because of shortness of breath associated with orthopnea, PND admitted for CHF exacerbation.   Hospital Course  #1 acute CHF exacerbation, with shortness of breath on arrival with orthopnea, PND, started on IV Lasix, patient feels much better now, after echocardiogram likely discharge home I do not know whether it systolic or diastolic heart failure yet.  Patient can take Lasix 40 mg p.o. twice daily for 3 days and then 40 mg daily thereafter. 2.  Malignant hypertension, BP 153/102, patient told me that he is not taking lisinopril anymore due to cough.  Stop with the lisinopril, added hydralazine 25 mg 3 times  daily.  Patient can continue Coreg 12.5 mg p.o. twice daily, 8 versus 20 mg p.o. daily, aspirin 80 mg daily, hydralazine 20 mg p.o. 3 times daily, Lasix 40 mg p.o. twice daily for 3 days and then 40 mg daily. 3./Anxiety, patient requested prescription for Xanax.  Prescribed 10 pills.  Patient can take Xanax nightly as needed as needed for anxiety.4.  Slightly 5.slightly elevated troponin secondary to hypertension, heart failure.  Acute CHF likely precipitated by accelerated hypertension. #6 diabetes mellitus type 2: Continue metformin. 7.  Hypokalemia replace potassium. Patient has a PCP in [facility], wants to have a PCP in [place] area.  Discharge Condition: Stable   Follow UP  Follow-up Information    [facility] Follow up on 06/26/2018.   Specialty:  Cardiology Why:  at 3:40pm Contact information: [address] [place] [address] [phone]            Discharge Instructions  and  Discharge Medications     Discharge Instructions    Amb Referral to HF Clinic   Complete by:  As directed      Allergies as of 06/16/2018   No Known Allergies     Medication List    TAKE these medications   ALPRAZolam 0.25 MG tablet Commonly known as:  XANAX Take 1 tablet (0.25 mg total) by mouth at bedtime as needed for anxiety.   atorvastatin 20 MG tablet Commonly known as:  LIPITOR Take 20 mg by mouth daily.   carvedilol 12.5 MG tablet Commonly known as:  COREG Take 1 tablet (12.5 mg total) by mouth 2 (  two) times daily with a meal.   furosemide 40 MG tablet Commonly known as:  Lasix Take 1 tablet (40 mg total) by mouth daily.   hydrALAZINE 25 MG tablet Commonly known as:  APRESOLINE Take 1 tablet (25 mg total) by mouth every 8 (eight) hours.   lisinopril 5 MG tablet Commonly known as:  PRINIVIL,ZESTRIL Take 5 mg by mouth daily.   metFORMIN 500 MG 24 hr tablet Commonly known as:   GLUCOPHAGE-XR Take 500 mg by mouth daily.         Diet and Activity recommendation: See Discharge Instructions above   Consults obtained -none   Major procedures and Radiology Reports - PLEASE review detailed and final reports for all details, in brief -      Dg Chest 2 View  Result Date: 06/15/2018 CLINICAL DATA:  Shortness of breath on exertion EXAM: CHEST - 2 VIEW COMPARISON:  None. FINDINGS: Cardiac shadow is enlarged. Mild vascular congestion is noted with mild interstitial edema. Small effusions are noted posteriorly. Mild atelectatic changes are noted in the bases bilaterally. IMPRESSION: Bibasilar atelectasis. Mild vascular congestion with interstitial edema. Electronically Signed   By: Alcide Clever M.D.   On: 06/15/2018 12:18    Micro Results     No results found for this or any previous visit (from the past 240 hour(s)).     Today   Subjective:   Steven Carey today has no headache,no chest abdominal pain,no new weakness tingling or numbness, feels much better wants to go home today.   Objective:   Blood pressure 124/88, pulse 62, temperature (!) 97.3 F (36.3 C), temperature source Oral, resp. rate 19, height 6\' 1"  (1.854 m), weight 99.1 kg, SpO2 100 %.   Intake/Output Summary (Last 24 hours) at 06/16/2018 1204 Last data filed at 06/16/2018 1146 Gross per 24 hour  Intake 480 ml  Output 2960 ml  Net -2480 ml    Exam Awake Alert, Oriented x 3, No new F.N deficits, Normal affect Krum.AT,PERRAL Supple Neck,No JVD, No cervical lymphadenopathy appriciated.  Symmetrical Chest wall movement, Good air movement bilaterally, CTAB RRR,No Gallops,Rubs or new Murmurs, No Parasternal Heave +ve B.Sounds, Abd Soft, Non tender, No organomegaly appriciated, No rebound -guarding or rigidity. No Cyanosis, Clubbing or edema, No new Rash or bruise  Data Review   CBC w Diff:  Lab Results  Component Value Date   WBC 8.2 06/15/2018   HGB 16.6 06/15/2018   HCT 50.8  06/15/2018   PLT 192 06/15/2018   LYMPHOPCT 21 06/15/2018   MONOPCT 10 06/15/2018   EOSPCT 1 06/15/2018   BASOPCT 1 06/15/2018    CMP:  Lab Results  Component Value Date   NA 142 06/16/2018   K 3.3 (L) 06/16/2018   CL 108 06/16/2018   CO2 25 06/16/2018   BUN 22 (H) 06/16/2018   CREATININE 1.26 (H) 06/16/2018   PROT 7.6 06/15/2018   ALBUMIN 4.1 06/15/2018   BILITOT 1.4 (H) 06/15/2018   ALKPHOS 37 (L) 06/15/2018   AST 24 06/15/2018   ALT 41 06/15/2018  .   Total Time in preparing paper work, data evaluation and todays exam - 35 minutes  Katha Hamming M.D on 06/16/2018 at 12:04 PM    Note: This dictation was prepared with Dragon dictation along with smaller phrase technology. Any transcriptional errors that result from this process are unintentional.

## 2018-06-16 NOTE — Progress Notes (Signed)
Cardiovascular and Pulmonary Nurse Navigator Note:    58 year old male with history of longstanding hypertension comes in because of shortness of breath associated with orthopnea, PND admitted for CHF exacerbation.  PMH of HTN and DM.    Echo performed on 06/16/2018.   Patient Name:   Steven Carey Date of Exam: 06/16/2018 Medical Rec #:  [id number]       Height:       73.0 in Accession #:    [id number]      Weight:       218.4 lb Date of Birth:  Feb 05, 1961       BSA:          2.23 m Patient Age:    58 years        BP:           156/110 mmHg Patient Gender: M               HR:           81 bpm. Exam Location:  [facility]    Procedure: 2D Echo, Cardiac Doppler and Color Doppler  Indications:     R94.31 Abnormal ECG   History:         Patient has no prior history of Echocardiogram examinations.                  Risk Factors: Hypertension and Diabetes.   Sonographer:     Humphrey Rolls RDCS (AE) Referring Phys:  [id number] [facility] D SALARY Diagnosing Phys: Cristal Deer End MD  IMPRESSIONS  1. The left ventricle has moderate-severely reduced systolic function, with an ejection fraction of 30-35%. The cavity size was normal. There is mildly increased left ventricular wall thickness. Left ventricular diastolic Doppler parameters are  consistent with pseudonormalization Elevated mean left atrial pressure Left ventricular diffuse hypokinesis.  2. The right ventricle has normal systolic function. The cavity was mildly enlarged. There is mildly increased right ventricular wall thickness. Right ventricular systolic pressure could not be assessed.  3. Left atrial size was moderately dilated.  4. Right atrial size was mildly dilated.  5. Small pericardial effusion.  6. The mitral valve is degenerative. Mild thickening of the mitral valve leaflet.  7. The tricuspid valve is normal in structure.  8. The aortic valve is tricuspid Aortic valve regurgitation is trivial by color flow Doppler.  9. The pulmonic  valve was not well visualized. Pulmonic valve regurgitation is mild by color flow Doppler. 10. The aortic root is normal in size and structure. __________________________________________________________________________________________  CHF Education / Review:???? Educational session with patient and his two sisters completed. Patient gave this RN verbal permission to speak about his medical condition in front of his sisters.  Patient had the "Living Better with Heart Failure" packet at bedside.  This RN reviewed educational booklet on HF.   Briefly reviewed definition of heart failure and signs and symptoms of an exacerbation.?Explained to patient that HF is a chronic illness which requires self-assessment / self-management along with help from the cardiologist/PCP.   ? *Reviewed importance of and reason behind checking weight daily in the AM, after using the bathroom, but before getting dressed. Patient plans to purchase a scale.   ? *Reviewed with patient the following information: *Discussed when to call the Dr= weight gain of >2-3lb overnight of 5lb in a week,  *Discussed yellow zone= call MD: weight gain of >2-3lb overnight of 5lb in a week, increased swelling, increased SOB when lying down, chest discomfort, dizziness, increased  fatigue *Red Zone= call 911: struggle to breath, fainting or near fainting, significant chest pain Discussed when patient should weigh given he works nights and sleeps during the day.  Patient will be weighing himself in the afternoons upon waking and after voiding.    *Instructed patient to take medications as prescribed for heart failure. Explained briefly why pt is on the medications (either make you feel better, live longer or keep you out of the hospital) and discussed monitoring and side effects.  Patient being discharged on Metformin, Alprazolam, Losartan, Lasix, Coreg, hydralazine, Lipitor, Potassium.  ? *Low sodium diet / carb modified diet -?Discussed low  sodium foods and foods to avoid.  Discussed reading food labels. Instructions provided on 2000 mg low sodium diet. Nutritional Handout from the ADA on Heart Failure Nutrition Therapy and list of Sodium Content of Foods given and reviewed with patient and sisters.    *Discussed fluid intake with patient as well. Patient not currently on a fluid restriction, but advised no more than 64 ounces of fluid per day.  This RN demonstrated this amount using the bedside water pitcher.  Patient asked if he could take water pitcher home with him when discharged.  This RN encouraged the patient to take water pitcher home to help him measure his fluid intake.    Smoking Cessation - Patient is a NEVER smoker.?? ? Exercise -?Encouraged patient to remain as active as possible.  Patient works nights.  We discussed how this is hard on one's body.  Patient does not currently exercise.  Patient needs further cardiac testing as an outpatient.  Informed patient and sisters that with the dx of CHF with an EF of 30 - 35% is is a candidate for Cardiac Rehab, but will need to wait until further work-up has been completed prior to starting Cardiac Rehab.   ? [facility] Failure Clinic - Explained the purpose of the [facility] Failure Clinic to patient and sisters.  Stressed the importance that being followed in this clinic does not replace PCP nor cardiologist, but simply provides an additional resource to help one manage heart failure and to keep you out of the hospital.  Patient's first appointment in the [facility] Failure Clinic is on June 26, 2018 at 3:40 p.m.   ? Again, the 5 Steps to Living Better with Heart Failure were reviewed with patient.  Patient and sisters appreciative of the information.    Army Melia, RN, BSN, [facility]? [facility]  [facility] Cardiac &?Pulmonary Rehab  Cardiovascular &?Pulmonary Nurse Navigator  Direct Line: [phone]  Department Phone #: [phone] Fax: [phone]? Email Address:  [email or web address]

## 2018-06-16 NOTE — Plan of Care (Signed)
?  Problem: Activity: ?Goal: Capacity to carry out activities will improve ?Outcome: Progressing ?  ?

## 2018-06-20 ENCOUNTER — Telehealth: Payer: Self-pay

## 2018-06-20 NOTE — Telephone Encounter (Signed)
-----   Message from [name], MD sent at 06/16/2018  3:24 PM EST ----- Regarding: New patient, recent HF diagnosis. Hello,  Could the following new patient be scheduled for evaluation of new systolic heart failure?  He can be seen by any provider, ideally within the next week (okay to use a 7 day hold slot).  Thanks.  Steven Carey

## 2018-06-20 NOTE — Telephone Encounter (Signed)
lmov . Holding 3/12 appt until tomorrow .

## 2018-06-22 ENCOUNTER — Ambulatory Visit: Payer: BLUE CROSS/BLUE SHIELD | Admitting: Cardiovascular Disease

## 2018-06-23 ENCOUNTER — Other Ambulatory Visit: Payer: Self-pay

## 2018-06-23 ENCOUNTER — Ambulatory Visit: Payer: BLUE CROSS/BLUE SHIELD | Admitting: Cardiovascular Disease

## 2018-06-23 NOTE — Progress Notes (Incomplete)
Cardiology Office Note   Date:  06/23/2018   ID:  Steven Carey, DOB 07/26/1960, MRN [id number]  PCP:  System, Pcp Not In  Cardiologist:  Lorine Bears, MD  No chief complaint on file.     History of Present Illness: Steven Carey is a 58 y.o. male who presents for congestive heart failure. He has a known history of essential hypertension, diabetes mellitus type II, and obesity. Family history of abdominal aortic aneurysm, heart disease, hypertension, and Parkinsonism.  He presented to the ED on 06/15/2018 for acute congestive heart failure. A DG Chest 2 view was done which showed bibasilar atelectasis and mild vascular congestion with interstitial edema.  The patient is *** doing well today. *** He presents today for a hospital follow-up.   He denies chest pain, SOB, palpitations or any other related symptoms or complaints at this time.    Past Medical History:  Diagnosis Date   Diabetes mellitus without complication (HCC)    Hypertension     No past surgical history on file.   Current Outpatient Medications  Medication Sig Dispense Refill   ALPRAZolam (XANAX) 0.25 MG tablet Take 1 tablet (0.25 mg total) by mouth at bedtime as needed for anxiety. 10 tablet 0   atorvastatin (LIPITOR) 20 MG tablet Take 20 mg by mouth daily.     carvedilol (COREG) 12.5 MG tablet Take 1 tablet (12.5 mg total) by mouth 2 (two) times daily with a meal. 60 tablet 0   furosemide (LASIX) 40 MG tablet Take 1 tablet (40 mg total) by mouth daily. Take 40 mg p.o. twice daily for 3 days thereafter 40 mg p.o. daily. 30 tablet 0   losartan (COZAAR) 50 MG tablet Take 1 tablet (50 mg total) by mouth daily. 30 tablet 0   metFORMIN (GLUCOPHAGE-XR) 500 MG 24 hr tablet Take 500 mg by mouth daily.     potassium chloride (KLOR-CON 10) 10 MEQ tablet Take 1 tablet (10 mEq total) by mouth daily. 30 tablet 0   No current facility-administered medications for this visit.     Allergies:   Patient has  no known allergies.    Social History:  The patient  reports that he has never smoked. He has never used smokeless tobacco. He reports that he does not drink alcohol or use drugs.   Family History:  The patient's family history is not on file.    ROS:  Please see the history of present illness.   Otherwise, review of systems are positive for none.   All other systems are reviewed and negative.    PHYSICAL EXAM: VS:  There were no vitals taken for this visit. , BMI There is no height or weight on file to calculate BMI. GEN: Well nourished, well developed, in no acute distress  HEENT: normal  Neck: no JVD, carotid bruits, or masses Cardiac: ***RRR; no murmurs, rubs, or gallops,no edema  Respiratory:  clear to auscultation bilaterally, normal work of breathing GI: soft, nontender, nondistended, + BS MS: no deformity or atrophy  Skin: warm and dry, no rash Neuro:  Strength and sensation are intact Psych: euthymic mood, full affect   EKG:  EKG is ordered today. The ekg ordered today demonstrates ***   Recent Labs: 06/15/2018: ALT 41; Hemoglobin 16.6; Platelets 192 06/16/2018: BUN 22; Creatinine, Ser 1.26; Potassium 3.3; Sodium 142    Lipid Panel No results found for: CHOL, TRIG, HDL, CHOLHDL, VLDL, LDLCALC, LDLDIRECT    Wt Readings from Last 3 Encounters:  06/16/18 218 lb 6.4 oz (99.1 kg)        ASSESSMENT AND PLAN:  1.  ***    Disposition:   FU with me in *** months  I, Jesus Reyes am acting as a Neurosurgeon for Lorine Bears, M.D.  {Add scribe attestation statement}  Signed, Lorine Bears, MD 06/23/18 [facility] [place], [place] [phone]

## 2018-06-26 ENCOUNTER — Ambulatory Visit: Payer: BLUE CROSS/BLUE SHIELD | Admitting: Family

## 2018-07-14 ENCOUNTER — Ambulatory Visit: Payer: BLUE CROSS/BLUE SHIELD | Admitting: Nurse Practitioner

## 2020-08-25 IMAGING — CR DG CHEST 2V
1 series · 2 of 2 positions shown · non-contrast
Comparison: None.

CLINICAL DATA: Shortness of breath on exertion

EXAM:
CHEST - 2 VIEW

[Series 1: dg chest 2 view · 0.14mm/px · 2 of 2 slices shown]
[im 1/2]
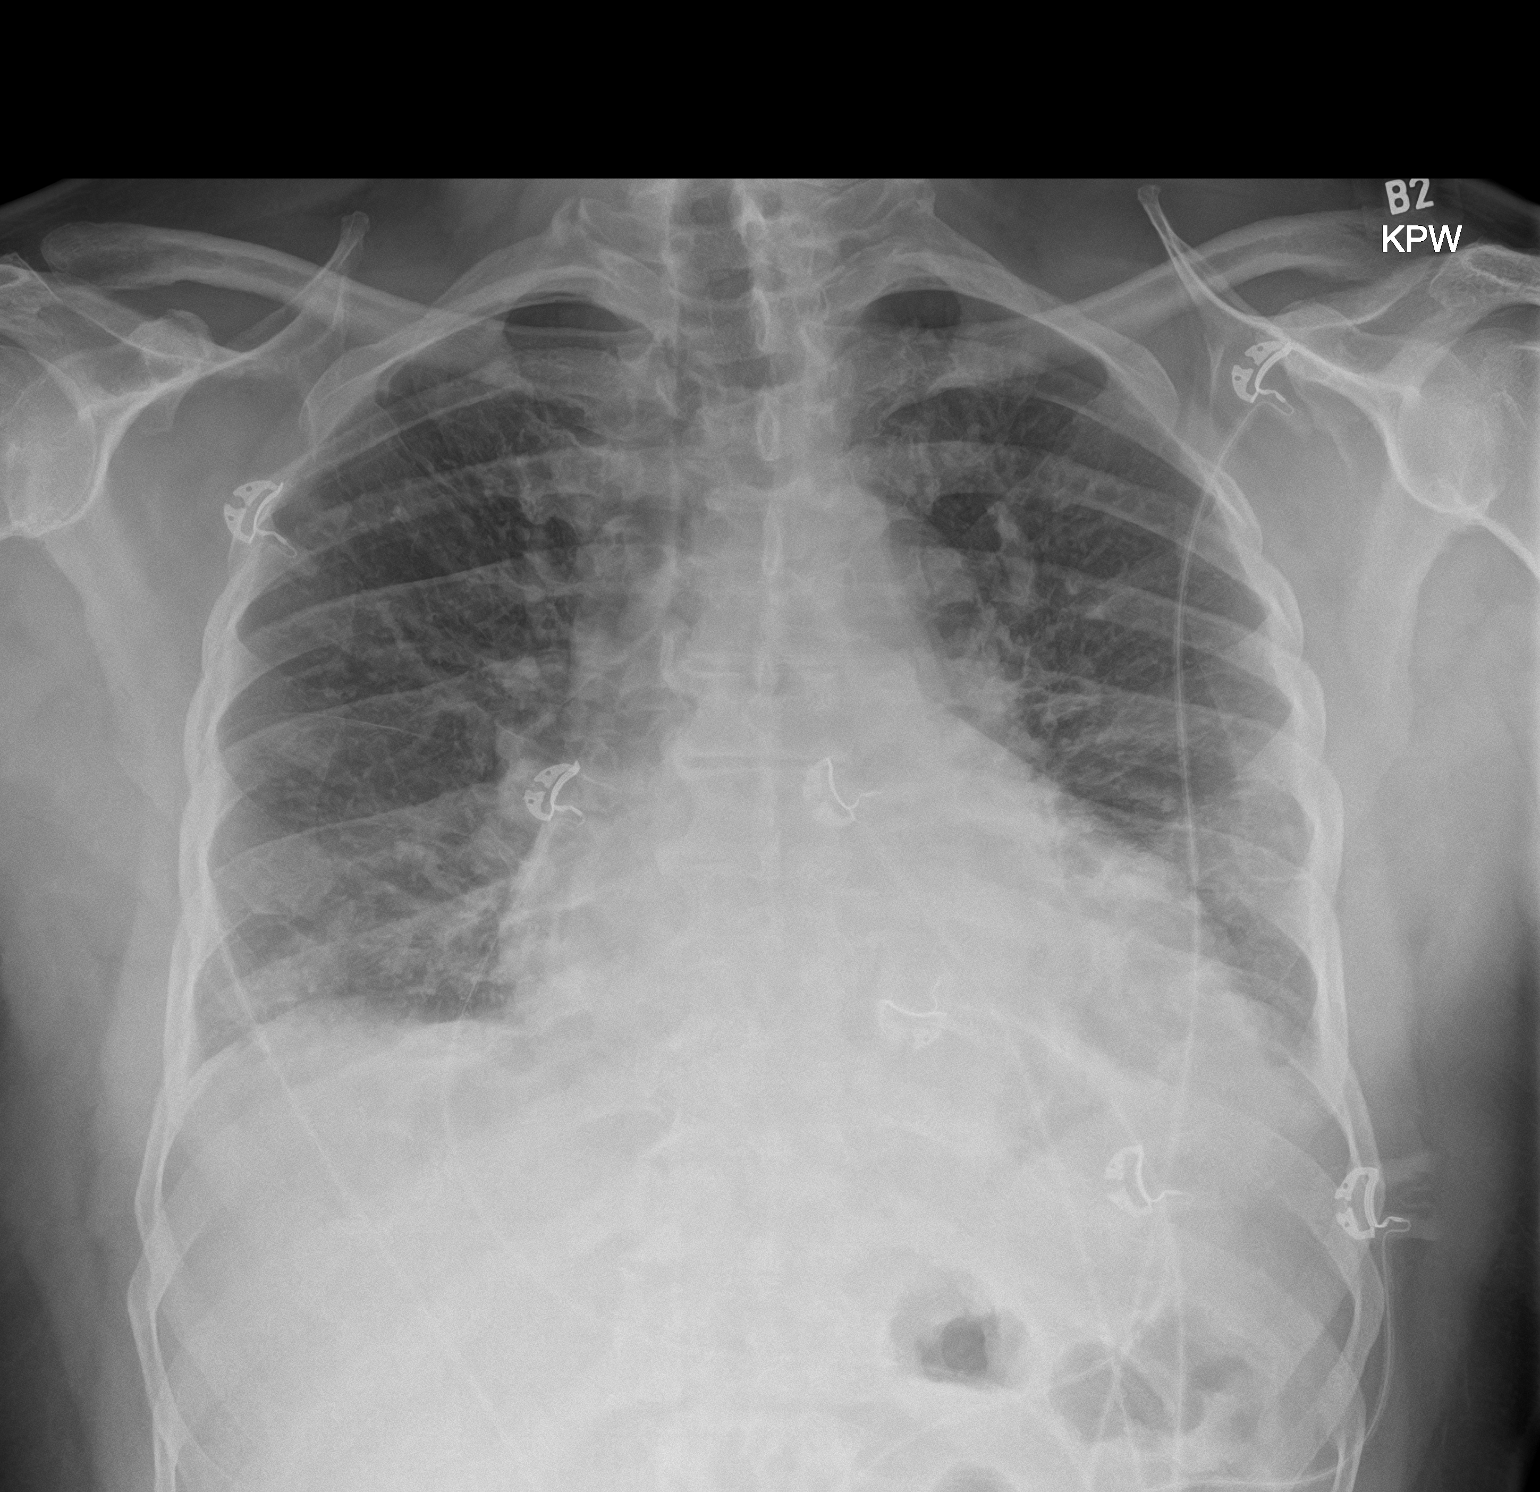
[im 2/2]
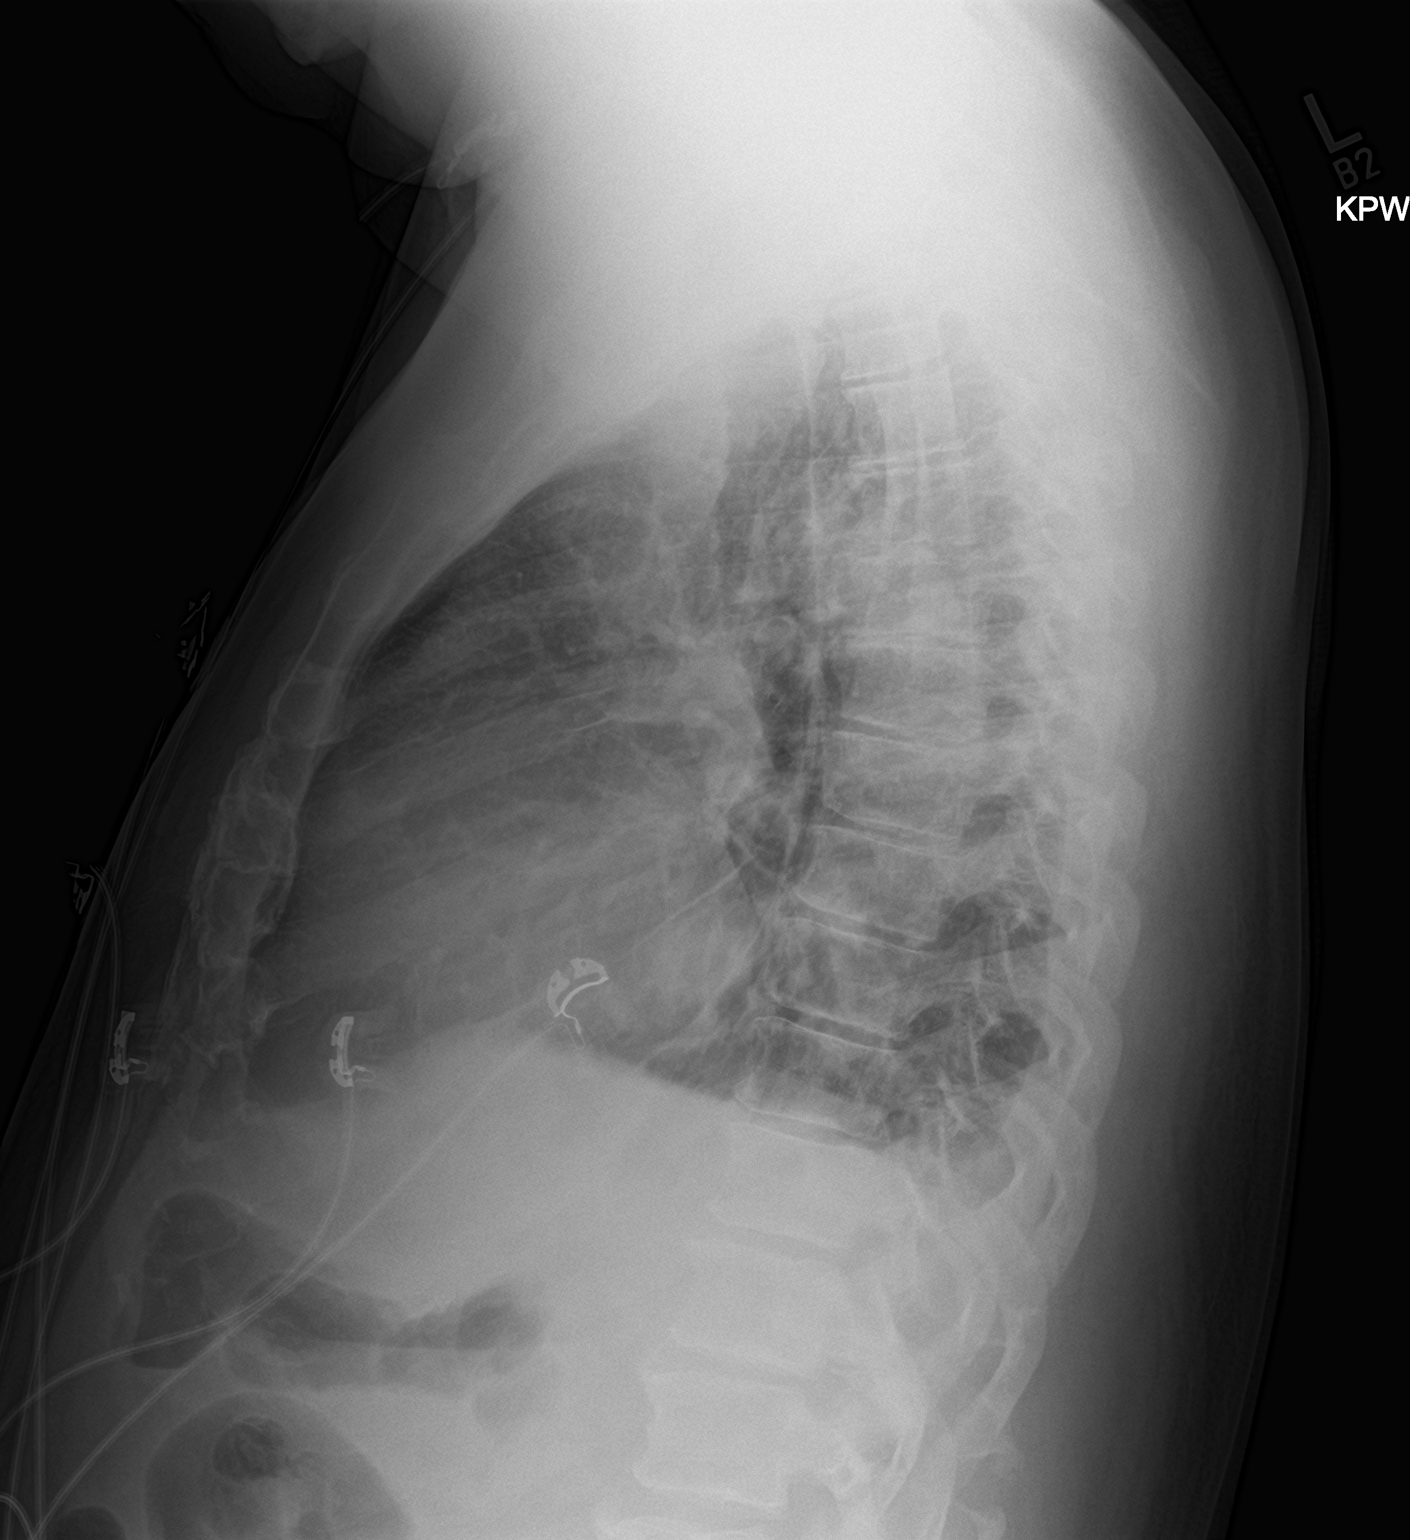

[2 of 2 positions shown; findings below may reference images not displayed]

FINDINGS: Cardiac shadow is enlarged. Mild vascular congestion is noted with
mild interstitial edema. Small effusions are noted posteriorly. Mild
atelectatic changes are noted in the bases bilaterally.
IMPRESSION: Bibasilar atelectasis.

Mild vascular congestion with interstitial edema.

## 2021-12-28 ENCOUNTER — Emergency Department: Payer: BLUE CROSS/BLUE SHIELD

## 2021-12-28 ENCOUNTER — Other Ambulatory Visit: Payer: Self-pay

## 2021-12-28 ENCOUNTER — Encounter: Payer: Self-pay | Admitting: Intensive Care

## 2021-12-28 ENCOUNTER — Emergency Department
Admission: EM | Admit: 2021-12-28 | Discharge: 2021-12-28 | Disposition: A | Discharge status: Home / Self Care | Payer: BLUE CROSS/BLUE SHIELD | Attending: Emergency Medicine | Admitting: Emergency Medicine

## 2021-12-28 DIAGNOSIS — E119 Type 2 diabetes mellitus without complications: Secondary | ICD-10-CM | POA: Insufficient documentation

## 2021-12-28 DIAGNOSIS — E8770 Fluid overload, unspecified: Secondary | ICD-10-CM

## 2021-12-28 DIAGNOSIS — R0600 Dyspnea, unspecified: Secondary | ICD-10-CM | POA: Diagnosis present

## 2021-12-28 DIAGNOSIS — I10 Essential (primary) hypertension: Secondary | ICD-10-CM | POA: Diagnosis not present

## 2021-12-28 DIAGNOSIS — R0609 Other forms of dyspnea: Secondary | ICD-10-CM

## 2021-12-28 LAB — CBC WITH DIFFERENTIAL/PLATELET
Abs Immature Granulocytes: 0.01 10*3/uL (ref 0.00–0.07)
Basophils Absolute: 0.1 10*3/uL (ref 0.0–0.1)
Basophils Relative: 1 %
Eosinophils Absolute: 0.2 10*3/uL (ref 0.0–0.5)
Eosinophils Relative: 3 %
HCT: 46 % (ref 39.0–52.0)
Hemoglobin: 14.7 g/dL (ref 13.0–17.0)
Immature Granulocytes: 0 %
Lymphocytes Relative: 29 %
Lymphs Abs: 1.9 10*3/uL (ref 0.7–4.0)
MCH: 26.6 pg (ref 26.0–34.0)
MCHC: 32 g/dL (ref 30.0–36.0)
MCV: 83.3 fL (ref 80.0–100.0)
Monocytes Absolute: 0.6 10*3/uL (ref 0.1–1.0)
Monocytes Relative: 9 %
Neutro Abs: 3.8 10*3/uL (ref 1.7–7.7)
Neutrophils Relative %: 58 %
Platelets: 178 10*3/uL (ref 150–400)
RBC: 5.52 MIL/uL (ref 4.22–5.81)
RDW: 15.9 % — ABNORMAL HIGH (ref 11.5–15.5)
WBC: 6.5 10*3/uL (ref 4.0–10.5)
nRBC: 0 % (ref 0.0–0.2)

## 2021-12-28 LAB — COMPREHENSIVE METABOLIC PANEL
ALT: 34 U/L (ref 0–44)
AST: 25 U/L (ref 15–41)
Albumin: 4.2 g/dL (ref 3.5–5.0)
Alkaline Phosphatase: 41 U/L (ref 38–126)
Anion gap: 9 (ref 5–15)
BUN: 18 mg/dL (ref 8–23)
CO2: 22 mmol/L (ref 22–32)
Calcium: 8.8 mg/dL — ABNORMAL LOW (ref 8.9–10.3)
Chloride: 110 mmol/L (ref 98–111)
Creatinine, Ser: 1.21 mg/dL (ref 0.61–1.24)
GFR, Estimated: >60 mL/min (ref >60)
Glucose, Bld: 148 mg/dL — ABNORMAL HIGH (ref 70–99)
Potassium: 3.5 mmol/L (ref 3.5–5.1)
Sodium: 141 mmol/L (ref 135–145)
Total Bilirubin: 1 mg/dL (ref 0.3–1.2)
Total Protein: 7.6 g/dL (ref 6.5–8.1)

## 2021-12-28 LAB — BLOOD GAS, VENOUS
Acid-Base Excess: 2.2 mmol/L — ABNORMAL HIGH (ref 0.0–2.0)
Bicarbonate: 27.3 mmol/L (ref 20.0–28.0)
O2 Saturation: 29.7 %
Patient temperature: 37
pCO2, Ven: 43 mmHg — ABNORMAL LOW (ref 44–60)
pH, Ven: 7.41 (ref 7.25–7.43)
pO2, Ven: <31 mmHg — CL (ref 32–45)

## 2021-12-28 LAB — TROPONIN I (HIGH SENSITIVITY)
Troponin I (High Sensitivity): 78 ng/L — ABNORMAL HIGH (ref <18)
Troponin I (High Sensitivity): 76 ng/L — ABNORMAL HIGH (ref <18)

## 2021-12-28 LAB — BRAIN NATRIURETIC PEPTIDE: B Natriuretic Peptide: 734 pg/mL — ABNORMAL HIGH (ref 0.0–100.0)

## 2021-12-28 MED ORDER — FUROSEMIDE 10 MG/ML IJ SOLN
40.0000 mg | Freq: Once | INTRAMUSCULAR | Status: AC
  Administered 2021-12-28: 40 mg via INTRAVENOUS
  Filled 2021-12-28: qty 4

## 2021-12-28 NOTE — ED Triage Notes (Addendum)
Patient c/o abdominal pain and wheezing. Denies SOB or CP. Reports he started feeling this way through the night and has history of retaining fluid.   Reports he takes blood pressure medicine morning and night and did not take this AM due to feeling fatigued and not good.

## 2021-12-28 NOTE — Discharge Instructions (Signed)
   Thank you for choosing us for your health care today!  Please see your primary doctor this week for a follow up appointment.   If you do not have a primary doctor call the following clinics to establish care:  If you have insurance:  [facility] [phone] [address] [place] [address]   [facility]  [phone] [address] [place] [address]   If you do not have insurance:  [facility]  [phone] [address] [place] [address]  Sometimes, in the early stages of certain disease courses it is difficult to detect in the emergency department evaluation -- so, it is important that you continue to monitor your symptoms and call your doctor right away or return to the emergency department if you develop any new or worsening symptoms.  It was my pleasure to care for you today.   Tyrann Donaho S. Javell Blackburn, MD  

## 2021-12-28 NOTE — ED Provider Notes (Incomplete)
[facility] Provider Note    Event Date/Time   First MD Initiated Contact with Patient 12/28/21 1938     (approximate)   History   Abdominal Pain   HPI  Steven Carey is a 61 y.o. male   Past medical history of diabetes, hypertension, heart failure, he was previously on Lasix but has been told to decrease his dose presents with exertional dyspnea and weight gain over the last several days.  Also complains of abdominal cramping pain that he states is due to hunger.    No Fever, chest pain, cough..   History was obtained via the patient and a review of external medical notes mission in March 2020 for congestive heart failure requiring diuresis.      Physical Exam   Triage Vital Signs: ED Triage Vitals  Enc Vitals Group     BP 12/28/21 1826 (!) 162/117     Pulse Rate 12/28/21 1826 81     Resp 12/28/21 1826 20     Temp 12/28/21 1826 98.9 F (37.2 C)     Temp Source 12/28/21 1826 Oral     SpO2 12/28/21 1826 95 %     Weight 12/28/21 1827 217 lb (98.4 kg)     Height 12/28/21 1827 6\' 1"  (1.854 m)     Head Circumference --      Peak Flow --      Pain Score 12/28/21 1826 5     Pain Loc --      Pain Edu? --      Excl. in [place]? --     Most recent vital signs: Vitals:   12/28/21 2200 12/28/21 2230  BP: (!) 163/140 (!) 156/108  Pulse: 83 72  Resp: 19 (!) 26  Temp:    SpO2: 100% 98%    General: Awake, no distress.  CV:  Good peripheral perfusion. Heart sound w/o murmur Resp:  Normal effort.  Bilateral rales at bases most prominently Abd:  No distention.  Soft and nontender deep palpation all quadrants. Other:  No edema in the legs.   ED Results / Procedures / Treatments   Labs (all labs ordered are listed, but only abnormal results are displayed) Labs Reviewed  CBC WITH DIFFERENTIAL/PLATELET - Abnormal; Notable for the following components:      Result Value   RDW 15.9 (*)    All other components within normal limits  COMPREHENSIVE  METABOLIC PANEL - Abnormal; Notable for the following components:   Glucose, Bld 148 (*)    Calcium 8.8 (*)    All other components within normal limits  BLOOD GAS, VENOUS - Abnormal; Notable for the following components:   pCO2, Ven 43 (*)    pO2, Ven <31 (*)    Acid-Base Excess 2.2 (*)    All other components within normal limits  BRAIN NATRIURETIC PEPTIDE - Abnormal; Notable for the following components:   B Natriuretic Peptide 734.0 (*)    All other components within normal limits  TROPONIN I (HIGH SENSITIVITY) - Abnormal; Notable for the following components:   Troponin I (High Sensitivity) 78 (*)    All other components within normal limits  TROPONIN I (HIGH SENSITIVITY) - Abnormal; Notable for the following components:   Troponin I (High Sensitivity) 76 (*)    All other components within normal limits     I reviewed labs and they are notable for elavated proBNP 700s    RADIOLOGY I independently reviewed and interpreted chest x-ray and see diffuse  opacities which may resemble pulmonary edema.  Cardiomegaly.   PROCEDURES:  Critical Care performed: No  Procedures   MEDICATIONS ORDERED IN ED: Medications  furosemide (LASIX) injection 40 mg (40 mg Intravenous Given 12/28/21 2049)    IMPRESSION / MDM / ASSESSMENT AND PLAN / ED COURSE  I reviewed the triage vital signs and the nursing notes.                              Differential diagnosis includes, but is not limited to, given elevated proBNP, rales, weight gain and clinical signs of pulmonary edema, will diurese patient with IV Lasix.  He responded very well to Lasix and feels much improved.  His abdomen was soft and nontender to palpation and patient claims that this is due to hunger pains only, offered CT scan of the abdomen and pelvis but patient declines stating he would like to go home at this time and that cramping had resolved on reassessment.  I also noted that his EKG was not completed, but patient feels  well at this time and wants to go home and prefers to defer further work up at this time given symptom resolution after diuresis in ED. I doubt cardiac ischemia as etiology given hx CHF with evidence of CHF exacerbation responsive to treatment, no chest pain, and likely cause due to decreased diuretic dose w flat trops.   I advised him to follow-up with his PMD closely return with any worsening.   Patient's presentation is most consistent with acute presentation with potential threat to life or bodily function.   Clinical Course as of 12/29/21 0143  Mon Dec 28, 2021  1932 WBC: 6.5 [LD]    Clinical Course User Index [LD] Harless Litten, Student-PA     FINAL CLINICAL IMPRESSION(S) / ED DIAGNOSES   Final diagnoses:  Dyspnea on exertion  Hypervolemia, unspecified hypervolemia type     Rx / DC Orders   ED Discharge Orders     None        Note:  This document was prepared using Dragon voice recognition software and may include unintentional dictation errors.    Lucillie Garfinkel, MD 12/29/21 Quinn Plowman, MD 12/29/21 [phone]

## 2022-01-03 ENCOUNTER — Emergency Department: Payer: BLUE CROSS/BLUE SHIELD

## 2022-01-03 ENCOUNTER — Other Ambulatory Visit: Payer: Self-pay

## 2022-01-03 ENCOUNTER — Emergency Department
Admission: EM | Admit: 2022-01-03 | Discharge: 2022-01-03 | Disposition: A | Discharge status: Home / Self Care | Payer: BLUE CROSS/BLUE SHIELD | Attending: Emergency Medicine | Admitting: Emergency Medicine

## 2022-01-03 DIAGNOSIS — R109 Unspecified abdominal pain: Secondary | ICD-10-CM

## 2022-01-03 LAB — COMPREHENSIVE METABOLIC PANEL WITH GFR
ALT: 41 U/L (ref 0–44)
AST: 30 U/L (ref 15–41)
Albumin: 4.3 g/dL (ref 3.5–5.0)
Alkaline Phosphatase: 42 U/L (ref 38–126)
Anion gap: 6 (ref 5–15)
BUN: 16 mg/dL (ref 8–23)
CO2: 27 mmol/L (ref 22–32)
Calcium: 9.1 mg/dL (ref 8.9–10.3)
Chloride: 107 mmol/L (ref 98–111)
Creatinine, Ser: 1.28 mg/dL — ABNORMAL HIGH (ref 0.61–1.24)
GFR, Estimated: >60 mL/min
Glucose, Bld: 160 mg/dL — ABNORMAL HIGH (ref 70–99)
Potassium: 4.2 mmol/L (ref 3.5–5.1)
Sodium: 140 mmol/L (ref 135–145)
Total Bilirubin: 1.6 mg/dL — ABNORMAL HIGH (ref 0.3–1.2)
Total Protein: 8 g/dL (ref 6.5–8.1)

## 2022-01-03 LAB — CBC
HCT: 48.1 % (ref 39.0–52.0)
Hemoglobin: 15.2 g/dL (ref 13.0–17.0)
MCH: 26.3 pg (ref 26.0–34.0)
MCHC: 31.6 g/dL (ref 30.0–36.0)
MCV: 83.2 fL (ref 80.0–100.0)
Platelets: 192 10*3/uL (ref 150–400)
RBC: 5.78 MIL/uL (ref 4.22–5.81)
RDW: 16.3 % — ABNORMAL HIGH (ref 11.5–15.5)
WBC: 7.6 10*3/uL (ref 4.0–10.5)
nRBC: 0 % (ref 0.0–0.2)

## 2022-01-03 LAB — URINALYSIS, ROUTINE W REFLEX MICROSCOPIC
Glucose, UA: NEGATIVE mg/dL
Ketones, ur: NEGATIVE mg/dL
Leukocytes,Ua: NEGATIVE
Nitrite: NEGATIVE
Protein, ur: >300 mg/dL — AB
Specific Gravity, Urine: >1.03 — ABNORMAL HIGH (ref 1.005–1.030)
pH: 5 (ref 5.0–8.0)

## 2022-01-03 MED ORDER — SODIUM CHLORIDE 0.9 % IV BOLUS
1000.0000 mL | Freq: Once | INTRAVENOUS | Status: AC
  Administered 2022-01-03: 1000 mL via INTRAVENOUS

## 2022-01-03 MED ORDER — DICYCLOMINE HCL 10 MG PO CAPS: 10.0000 mg | ORAL_CAPSULE | Freq: Three times a day (TID) | ORAL | 0 refills | Status: AC | PRN

## 2022-01-03 MED ORDER — ONDANSETRON HCL 4 MG PO TABS: 4.0000 mg | ORAL_TABLET | Freq: Three times a day (TID) | ORAL | 0 refills | Status: DC | PRN

## 2022-01-03 MED ORDER — MORPHINE SULFATE (PF) 4 MG/ML IV SOLN
4.0000 mg | Freq: Once | INTRAVENOUS | Status: AC
  Administered 2022-01-03: 4 mg via INTRAVENOUS
  Filled 2022-01-03: qty 1

## 2022-01-03 MED ORDER — DICYCLOMINE HCL 10 MG PO CAPS: 10.0000 mg | ORAL_CAPSULE | Freq: Three times a day (TID) | ORAL | 0 refills | Status: DC | PRN

## 2022-01-03 MED ORDER — IOHEXOL 300 MG/ML  SOLN
100.0000 mL | Freq: Once | INTRAMUSCULAR | Status: AC | PRN
  Administered 2022-01-03: 100 mL via INTRAVENOUS

## 2022-01-03 MED ORDER — ONDANSETRON HCL 4 MG/2ML IJ SOLN
4.0000 mg | Freq: Once | INTRAMUSCULAR | Status: AC
  Administered 2022-01-03: 4 mg via INTRAVENOUS
  Filled 2022-01-03: qty 2

## 2022-01-03 MED ORDER — ONDANSETRON HCL 4 MG PO TABS: 4.0000 mg | ORAL_TABLET | Freq: Three times a day (TID) | ORAL | 0 refills | Status: AC | PRN

## 2022-01-03 NOTE — Discharge Instructions (Addendum)
Please follow up with the gi doctor. Please also talk to your doctor about rechecking your urine for protein. Please seek medical attention for any high fevers, chest pain, shortness of breath, change in behavior, persistent vomiting, bloody stool or any other new or concerning symptoms.

## 2022-01-03 NOTE — ED Triage Notes (Signed)
Pt states has been having llq pain that radiates to left flank since Monday. Pt states he was here Monday for same and did not receive a ct scan, but was told he has "fluid on his heart". Pt appears in no acute distress.

## 2022-01-03 NOTE — ED Provider Notes (Signed)
[facility] Provider Note    Event Date/Time   First MD Initiated Contact with Patient 01/03/22 2133     (approximate)   History   Abdominal Pain   HPI  Steven Carey is a 61 y.o. male  who presents to the emergency department today because of concern for abdominal pain. Has been present for roughly one week. Located in the left upper quadrant. The patient has had associated nausea. No bloody stool or black or tarry stool. Denies any fevers.   Physical Exam   Triage Vital Signs: ED Triage Vitals  Enc Vitals Group     BP 01/03/22 2047 (!) 183/102     Pulse Rate 01/03/22 2045 88     Resp 01/03/22 2045 18     Temp 01/03/22 2045 98.6 F (37 C)     Temp Source 01/03/22 2045 Oral     SpO2 01/03/22 2047 92 %     Weight 01/03/22 2045 216 lb 0.8 oz (98 kg)     Height 01/03/22 2045 6\' 1"  (1.854 m)     Head Circumference --      Peak Flow --      Pain Score 01/03/22 2045 8     Pain Loc --      Pain Edu? --      Excl. in [place]? --     Most recent vital signs: Vitals:   01/03/22 2045 01/03/22 2047  BP:  (!) 183/102  Pulse: 88   Resp: 18   Temp: 98.6 F (37 C)   SpO2:  92%   General: Awake, alert, oriented. CV:  Good peripheral perfusion. Regular rate and rhythm. Resp:  Normal effort. Lungs clear. Abd:  No distention. Minimal tenderness in the upper abdomen.    ED Results / Procedures / Treatments   Labs (all labs ordered are listed, but only abnormal results are displayed) Labs Reviewed  COMPREHENSIVE METABOLIC PANEL - Abnormal; Notable for the following components:      Result Value   Glucose, Bld 160 (*)    Creatinine, Ser 1.28 (*)    Total Bilirubin 1.6 (*)    All other components within normal limits  CBC - Abnormal; Notable for the following components:   RDW 16.3 (*)    All other components within normal limits  URINALYSIS, ROUTINE W REFLEX MICROSCOPIC - Abnormal; Notable for the following components:   APPearance CLEAR (*)     Specific Gravity, Urine >1.030 (*)    Hgb urine dipstick TRACE (*)    Bilirubin Urine SMALL (*)    Protein, ur >300 (*)    Bacteria, UA FEW (*)    All other components within normal limits     EKG  None   RADIOLOGY I independently interpreted and visualized the CT abd/pel. My interpretation: No free air Radiology interpretation:  IMPRESSION:  1. Small right-greater-than-left pleural effusions.  2. Cardiomegaly.  3. No acute abnormality in the abdomen or pelvis.  4. Marked enlargement of the prostate.      PROCEDURES:  Critical Care performed: No  Procedures   MEDICATIONS ORDERED IN ED: Medications - No data to display   IMPRESSION / MDM / [place] / ED COURSE  I reviewed the triage vital signs and the nursing notes.                              Differential diagnosis includes, but is not limited  to, gastritis, ulcer, kidney stone.  Patient's presentation is most consistent with acute presentation with potential threat to life or bodily function.  Patient presents to the emergency department today because of concern for abdominal pain for roughly 1 week. The patient's blood work without any concerning leukocytosis. No concerning electrolyte abnormalities. Did obtain ct scan which did not show any acute intraabdominal abnormality. Patient felt better after medication. Will discharge home with bentyl and zofran.    FINAL CLINICAL IMPRESSION(S) / ED DIAGNOSES   Final diagnoses:  Abdominal pain, unspecified abdominal location     Note:  This document was prepared using Dragon voice recognition software and may include unintentional dictation errors.    Phineas Semen, MD 01/03/22 [phone]

## 2022-01-03 NOTE — ED Notes (Signed)
Pt transported to CT at this time.

## 2022-01-03 NOTE — ED Notes (Signed)
EDP Goodman in room at this time.
